# Patient Record
Sex: Male | Born: 1990 | Race: White | Hispanic: No | Marital: Single | Smoking: Current every day smoker
Health system: Southern US, Community
[De-identification: ages and names within clinical notes are randomized; demographics above are authoritative.]

---
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
## 2020-09-18 ENCOUNTER — Other Ambulatory Visit: Payer: Self-pay

## 2020-09-18 ENCOUNTER — Ambulatory Visit
Admission: EM | Admit: 2020-09-18 | Discharge: 2020-09-18 | Disposition: A | Discharge status: Home / Self Care | Payer: Self-pay

## 2020-09-18 DIAGNOSIS — S060X0A Concussion without loss of consciousness, initial encounter: Secondary | ICD-10-CM
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
## 2020-09-18 NOTE — Discharge Instructions (Addendum)
I believe you have a mild concussion.  I have put in affirmation your packet about things to do with this.  For any worsening problems you will need to go to the ER for a CT scan
